# Patient Record
Sex: Male | Born: 1970 | Race: Black or African American | Hispanic: No | Marital: Married | Smoking: Never smoker
Health system: Southern US, Community
[De-identification: ages and names within clinical notes are randomized; demographics above are authoritative.]

## PROBLEM LIST (undated history)

## (undated) ENCOUNTER — Ambulatory Visit: Admission: EM | Payer: No Typology Code available for payment source | Source: Home / Self Care

## (undated) DIAGNOSIS — N401 Enlarged prostate with lower urinary tract symptoms: Secondary | ICD-10-CM

## (undated) DIAGNOSIS — N500 Atrophy of testis: Secondary | ICD-10-CM

## (undated) DIAGNOSIS — R350 Frequency of micturition: Secondary | ICD-10-CM

## (undated) DIAGNOSIS — N529 Male erectile dysfunction, unspecified: Secondary | ICD-10-CM

## (undated) DIAGNOSIS — N138 Other obstructive and reflux uropathy: Secondary | ICD-10-CM

## (undated) HISTORY — DX: Benign prostatic hyperplasia with lower urinary tract symptoms: N40.1

## (undated) HISTORY — DX: Male erectile dysfunction, unspecified: N52.9

## (undated) HISTORY — DX: Frequency of micturition: R35.0

## (undated) HISTORY — DX: Benign prostatic hyperplasia with lower urinary tract symptoms: N13.8

## (undated) HISTORY — DX: Atrophy of testis: N50.0

---

## 2014-09-16 ENCOUNTER — Emergency Department: Payer: Self-pay | Admitting: Emergency Medicine

## 2015-01-24 ENCOUNTER — Telehealth: Payer: Self-pay | Admitting: Family Medicine

## 2015-01-24 NOTE — Telephone Encounter (Signed)
I need more information about what symptoms he is experiencing at this time since stopping the finasteride.

## 2015-01-24 NOTE — Telephone Encounter (Signed)
Patient states he was having medication reaction. He was not getting an erection on Finasteride 5mg , so he stopped taking the medication.  He is still taking the Tamsilosin but states he is still not feeling the same after being on the Finesteride. I offered to schedule and appointment but patient declined and just wants a call back.

## 2015-01-25 NOTE — Telephone Encounter (Signed)
Insurance does not pay for MRI's for sizing of the prostate.  If Dr. Barnetta Hammersmith needs an MRI, Dr. Barnetta Hammersmith needs to order one.  They cost around $2000.00 out of pocket.

## 2015-01-25 NOTE — Telephone Encounter (Signed)
Spoke with pt in reference to finasteride. Pt stated since taking medication he has felt numb in the penis, unable to get an erection even with taking 50mg  of Viagra, and unable to have an erection in the mornings. Pt stated he has stopped all medications at this time "to give his body a rest" and has changed his diet. Pt also requested a referral for a MRI. Pt found a doctor in [place], Dr. Barnetta Hammersmith, who performs a procedure called PAD(prostate artery demobalizer). Per pt Dr. Barnetta Hammersmith office stated pt would need a referral and MRI prior to being seen by them. Per pt Dr. Barnetta Hammersmith needs the MRI to see how big prostate is and if he is eligible for procedure. Please advise. Cw,lpn

## 2015-01-26 NOTE — Telephone Encounter (Signed)
Spoke with pt in reference to MRI. Pt was very unhappy and rude when information was given. Nurse reinforced with pt MRIs are not done for prostate sizing and if he wanted to go see Dr. Barnetta Hammersmith then Dr. Barnetta Hammersmith would have to order it for his purposes. Nurse reinforced that insurance will only pay for medical necessities not pt elected procedures. Pt at which point explained he was unhappy with current medication and wanted something different. Nurse made pt aware he would have to make an appt to speak with Carollee Herter about that, nurse is not qualified to make those decisions. Pt was very rude and demanded tx via telephone from [facility]. Per Carollee Herter pt needs an appt to be seen, she does not tx pt via telephone. Pt was unhappy but was transferred to the front to make a f/u appt.  Cw,lpn

## 2015-02-07 ENCOUNTER — Telehealth: Payer: Self-pay | Admitting: Urology

## 2015-02-07 NOTE — Telephone Encounter (Signed)
LMOM

## 2015-02-07 NOTE — Telephone Encounter (Signed)
Patient is requesting a letter from Nicholas CowboyShannon McGowan, PA-C to his insurance company, along with office notes that includes diagnosis and symptoms, that indicates his symptoms are comparable to a 44 year old patient with BPH .  He is also requesting a referral to [facility].  Please advise.

## 2015-02-07 NOTE — Telephone Encounter (Signed)
I am not understanding this patient's request.  Why does he need a letter?  And why does he need a referral to [facility]?

## 2015-02-23 ENCOUNTER — Telehealth: Payer: Self-pay

## 2015-02-23 NOTE — Telephone Encounter (Signed)
Pt called asking if he can have a ultrasound performed since an MRI will not be ordered. Pt also asked for a [facility] referral because they perform the procedure he is electing to have to his prostate. Pt also requested his medical records. Please advise.

## 2015-02-23 NOTE — Telephone Encounter (Signed)
The folks at [facility] will most likely want to perform an ultrasound themselves if they are going to do the procedure. We can go ahead and make a referral to [facility]rology for a second opinion concerning his erectile dysfunction and BPH.

## 2015-02-26 ENCOUNTER — Telehealth: Payer: Self-pay | Admitting: Urology

## 2015-02-26 NOTE — Telephone Encounter (Signed)
Patient is still waiting on his referral to [facility]. It needs to be put in the work queue for referrals. We checked and it looks like it was never put in.  thanks

## 2015-03-07 ENCOUNTER — Other Ambulatory Visit: Payer: Self-pay | Admitting: Urology

## 2015-03-07 DIAGNOSIS — N528 Other male erectile dysfunction: Secondary | ICD-10-CM

## 2015-04-20 DIAGNOSIS — H50332 Intermittent monocular exotropia, left eye: Secondary | ICD-10-CM | POA: Insufficient documentation

## 2015-05-29 ENCOUNTER — Telehealth: Payer: Self-pay

## 2015-05-29 DIAGNOSIS — N529 Male erectile dysfunction, unspecified: Secondary | ICD-10-CM

## 2015-05-29 MED ORDER — SILDENAFIL CITRATE 100 MG PO TABS: 100.0000 mg | ORAL_TABLET | ORAL | Status: DC | PRN

## 2015-05-29 NOTE — Telephone Encounter (Signed)
Pt pharmacy sent a refill request for viargra 100mg . Please advise.

## 2015-05-29 NOTE — Telephone Encounter (Signed)
Medication called into pt pharmacy  

## 2015-05-29 NOTE — Telephone Encounter (Signed)
Okay to refill? 

## 2015-06-01 ENCOUNTER — Encounter: Payer: Self-pay | Admitting: *Deleted

## 2015-06-01 ENCOUNTER — Other Ambulatory Visit: Payer: Self-pay | Admitting: *Deleted

## 2015-06-11 ENCOUNTER — Ambulatory Visit: Payer: Self-pay | Admitting: Urology

## 2015-07-09 DIAGNOSIS — N398 Other specified disorders of urinary system: Secondary | ICD-10-CM | POA: Insufficient documentation

## 2017-11-27 ENCOUNTER — Emergency Department
Admission: EM | Admit: 2017-11-27 | Discharge: 2017-11-27 | Disposition: A | Discharge status: Home / Self Care | Payer: Self-pay | Attending: Emergency Medicine | Admitting: Emergency Medicine

## 2017-11-27 ENCOUNTER — Other Ambulatory Visit: Payer: Self-pay

## 2017-11-27 ENCOUNTER — Encounter: Payer: Self-pay | Admitting: Emergency Medicine

## 2017-11-27 ENCOUNTER — Emergency Department: Payer: Self-pay

## 2017-11-27 DIAGNOSIS — R079 Chest pain, unspecified: Secondary | ICD-10-CM

## 2017-11-27 DIAGNOSIS — Z79899 Other long term (current) drug therapy: Secondary | ICD-10-CM | POA: Insufficient documentation

## 2017-11-27 LAB — BASIC METABOLIC PANEL
Anion gap: 7 (ref 5–15)
BUN: 16 mg/dL (ref 6–20)
CALCIUM: 8.9 mg/dL (ref 8.9–10.3)
CO2: 28 mmol/L (ref 22–32)
Chloride: 103 mmol/L (ref 101–111)
Creatinine, Ser: 1.15 mg/dL (ref 0.61–1.24)
GLUCOSE: 79 mg/dL (ref 65–99)
POTASSIUM: 4 mmol/L (ref 3.5–5.1)
Sodium: 138 mmol/L (ref 135–145)

## 2017-11-27 LAB — CBC
HEMATOCRIT: 45.4 % (ref 40.0–52.0)
Hemoglobin: 15.4 g/dL (ref 13.0–18.0)
MCH: 33.5 pg (ref 26.0–34.0)
MCHC: 34 g/dL (ref 32.0–36.0)
MCV: 98.6 fL (ref 80.0–100.0)
Platelets: 195 10*3/uL (ref 150–440)
RBC: 4.6 MIL/uL (ref 4.40–5.90)
RDW: 13 % (ref 11.5–14.5)
WBC: 5.9 10*3/uL (ref 3.8–10.6)

## 2017-11-27 LAB — TROPONIN I

## 2017-11-27 MED ORDER — KETOROLAC TROMETHAMINE 30 MG/ML IJ SOLN
INTRAMUSCULAR | Status: AC
  Administered 2017-11-27: 30 mg via INTRAVENOUS
  Filled 2017-11-27: qty 1

## 2017-11-27 MED ORDER — GI COCKTAIL ~~LOC~~
ORAL | Status: AC
  Filled 2017-11-27: qty 30

## 2017-11-27 MED ORDER — PANTOPRAZOLE SODIUM 40 MG PO TBEC: 40.0000 mg | DELAYED_RELEASE_TABLET | Freq: Every day | ORAL | 1 refills | Status: DC

## 2017-11-27 MED ORDER — KETOROLAC TROMETHAMINE 30 MG/ML IJ SOLN
30.0000 mg | Freq: Once | INTRAMUSCULAR | Status: AC
  Administered 2017-11-27: 30 mg via INTRAVENOUS

## 2017-11-27 MED ORDER — GI COCKTAIL ~~LOC~~
30.0000 mL | Freq: Once | ORAL | Status: AC
  Administered 2017-11-27: 30 mL via ORAL

## 2017-11-27 NOTE — ED Notes (Signed)
MD at bedside. 

## 2017-11-27 NOTE — ED Provider Notes (Signed)
[facility] Emergency Department Provider Note    ____________________________________________   I have reviewed the triage vital signs and the nursing notes.   HISTORY  Chief Complaint Chest Pain   History limited by: Not Limited   HPI Nicholas Berg is a 47 y.o. male who presents to the emergency department today because of concerns for chest pain.  Is located in the center chest.  It started about 3 days ago.  Has been fairly constant.  Describes it as pressure-like.  He does feel that it is relieved and aggravated depending on what position he is in.  He has not had any significant shortness of breath but feels some discomfort when he tries to take a deep breath.  Patient states he has a history of acid reflux and is tried some Tums without any significant relief.  He denies any unusual exertion or ingestion prior to the pain started.  He denies any fevers.   Per medical record review patient has a history of BPH.  Past Medical History:  Diagnosis Date  . BPH with obstruction/lower urinary tract symptoms   . Erectile dysfunction   . Testicular atrophy   . Urinary frequency     There are no active problems to display for this patient.   History reviewed. No pertinent surgical history.  Prior to Admission medications   Medication Sig Start Date End Date Taking? Authorizing Provider  cyclobenzaprine (FLEXERIL) 10 MG tablet TK 1 T PO TID PRN 03/01/15   [provider]  finasteride (PROSCAR) 5 MG tablet Take 5 mg by mouth daily.    [provider]  meloxicam (MOBIC) 7.5 MG tablet TK 1 T PO  BID 03/01/15   [provider]  sildenafil (VIAGRA) 100 MG tablet Take 1 tablet (100 mg total) by mouth as needed for erectile dysfunction. 05/29/15   Michiel CowboyMcGowan, Shannon A, PA-C  tamsulosin (FLOMAX) 0.4 MG CAPS capsule Take 0.4 mg by mouth.    [provider]    Allergies Patient has no known allergies.  Family History  Problem  Relation Age of Onset  . Prostate cancer Neg Hx     Social History Social History   Tobacco Use  . Smoking status: Never Smoker  Substance Use Topics  . Alcohol use: Yes    Alcohol/week: 0.0 oz    Comment: occasiona;  Marland Kitchen. Drug use: Not on file    Review of Systems Constitutional: No fever/chills Eyes: No visual changes. ENT: No sore throat. Cardiovascular: Positive for chest pain. Respiratory: Denies shortness of breath. Gastrointestinal: No abdominal pain.  No nausea, no vomiting.  No diarrhea.   Genitourinary: Negative for dysuria. Musculoskeletal: Negative for back pain. Skin: Negative for rash. Neurological: Negative for headaches, focal weakness or numbness.  ____________________________________________   PHYSICAL EXAM:  VITAL SIGNS: ED Triage Vitals  Enc Vitals Group     BP 11/27/17 1545 116/77     Pulse Rate 11/27/17 1545 (!) 56     Resp 11/27/17 1545 16     Temp 11/27/17 1545 98.2 F (36.8 C)     Temp Source 11/27/17 1545 Oral     SpO2 11/27/17 1545 100 %     Weight 11/27/17 1544 164 lb (74.4 kg)     Height 11/27/17 1544 6' (1.829 m)     Head Circumference --      Peak Flow --      Pain Score 11/27/17 1544 6   Constitutional: Alert and oriented. Well appearing and in  no distress. Eyes: Conjunctivae are normal.  ENT   Head: Normocephalic and atraumatic.   Nose: No congestion/rhinnorhea.   Mouth/Throat: Mucous membranes are moist.   Neck: No stridor. Hematological/Lymphatic/Immunilogical: No cervical lymphadenopathy. Cardiovascular: Normal rate, regular rhythm.  No murmurs, rubs, or gallops. Respiratory: Normal respiratory effort without tachypnea nor retractions. Breath sounds are clear and equal bilaterally. No wheezes/rales/rhonchi. Gastrointestinal: Soft and non tender. No rebound. No guarding.  Genitourinary: Deferred Musculoskeletal: Normal range of motion in all extremities. No lower extremity edema. Neurologic:  Normal speech and  language. No gross focal neurologic deficits are appreciated.  Skin:  Skin is warm, dry and intact. No rash noted. Psychiatric: Mood and affect are normal. Speech and behavior are normal. Patient exhibits appropriate insight and judgment.  ____________________________________________    LABS (pertinent positives/negatives)  BMP wnl CBC wnl Trop <0.03  ____________________________________________   EKG  I, Phineas Semen, attending physician, personally viewed and interpreted this EKG  EKG Time: 1544 Rate: 52 Rhythm: sinus bradycardia Axis: normal Intervals: qtc 388 QRS: narrow ST changes: no st elevation Impression: sinus bradycardia otherwise normal ekg   ____________________________________________    RADIOLOGY  CXR Normal chest  ____________________________________________   PROCEDURES  Procedures  ____________________________________________   INITIAL IMPRESSION / ASSESSMENT AND PLAN / ED COURSE  Pertinent labs & imaging results that were available during my care of the patient were reviewed by me and considered in my medical decision making (see chart for details).  Patient presented to the emergency department today because of concerns for chest pain.  Differential be broad including pneumothorax, pneumonia, gastritis, pericardial effusion, pericarditis, ACS, costochondritis amongst other etiologies.  Chest x-ray EKG and blood work without concerning findings.  Patient does have a history of acid reflux.  Discussed findings with patient.  Will plan on starting patient on PPI and given information about food choices with GERD.  ____________________________________________   FINAL CLINICAL IMPRESSION(S) / ED DIAGNOSES  Final diagnoses:  Nonspecific chest pain     Note: This dictation was prepared with Dragon dictation. Any transcriptional errors that result from this process are unintentional     Phineas Semen, MD 11/27/17 1901

## 2017-11-27 NOTE — ED Triage Notes (Signed)
Pt to ED via POV c/o chest pain on and off for the past 3 days. Pt states that he has been trying to "work through it". Pt denies N/V, and shortness of breath but states that he can tell a difference in his breathing. Pt states that the pain is in the center of chest and does not radiate. Pt is in NAD at this time.

## 2017-11-27 NOTE — Discharge Instructions (Addendum)
Please seek medical attention for any high fevers, chest pain, shortness of breath, change in behavior, persistent vomiting, bloody stool or any other new or concerning symptoms.  

## 2018-11-26 IMAGING — CR DG CHEST 2V
2 series · 2 of 2 positions shown · non-contrast
Comparison: None.

CLINICAL DATA: Central chest pain beginning 3 days ago.

EXAM:
CHEST - 2 VIEW

[chest pa]
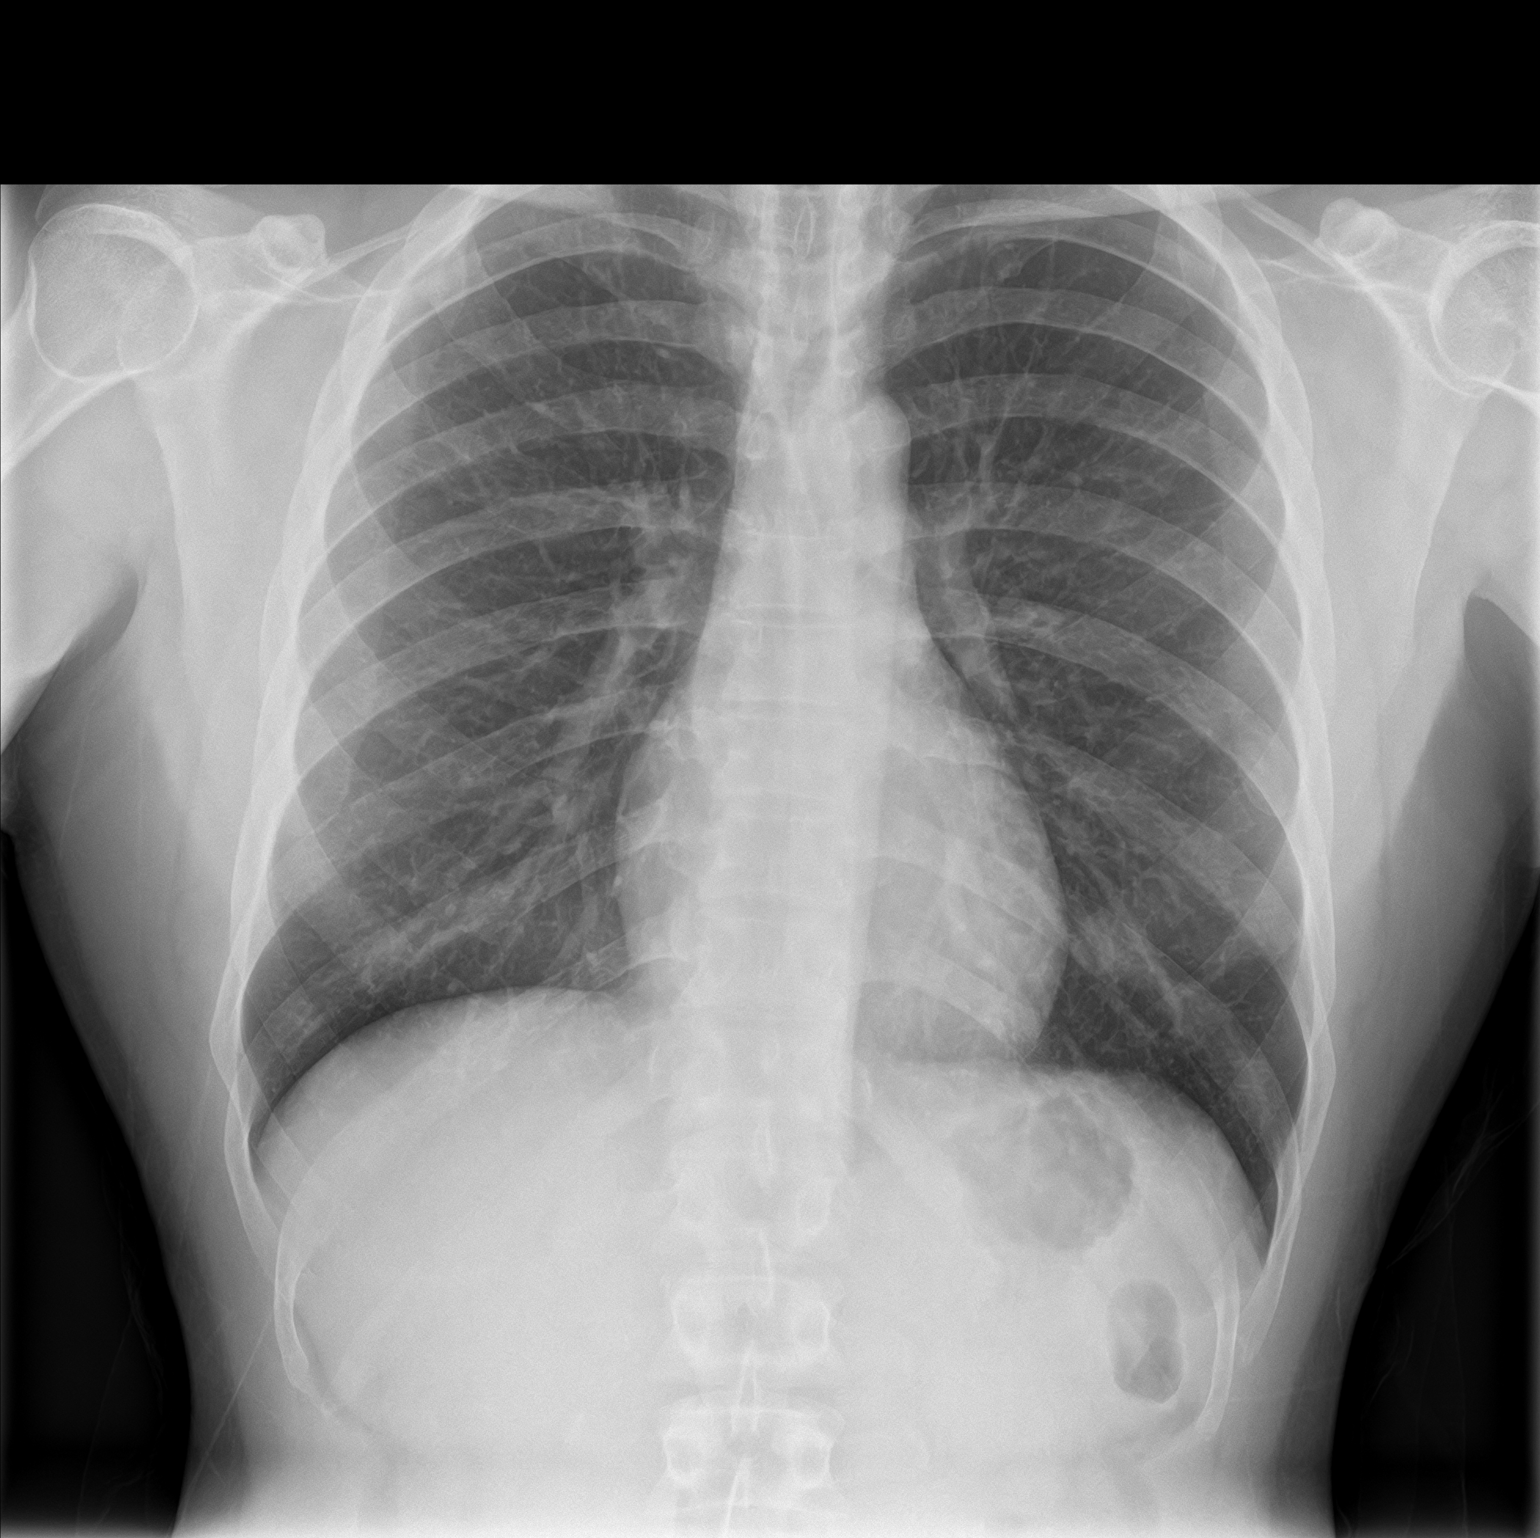

[chest lat]
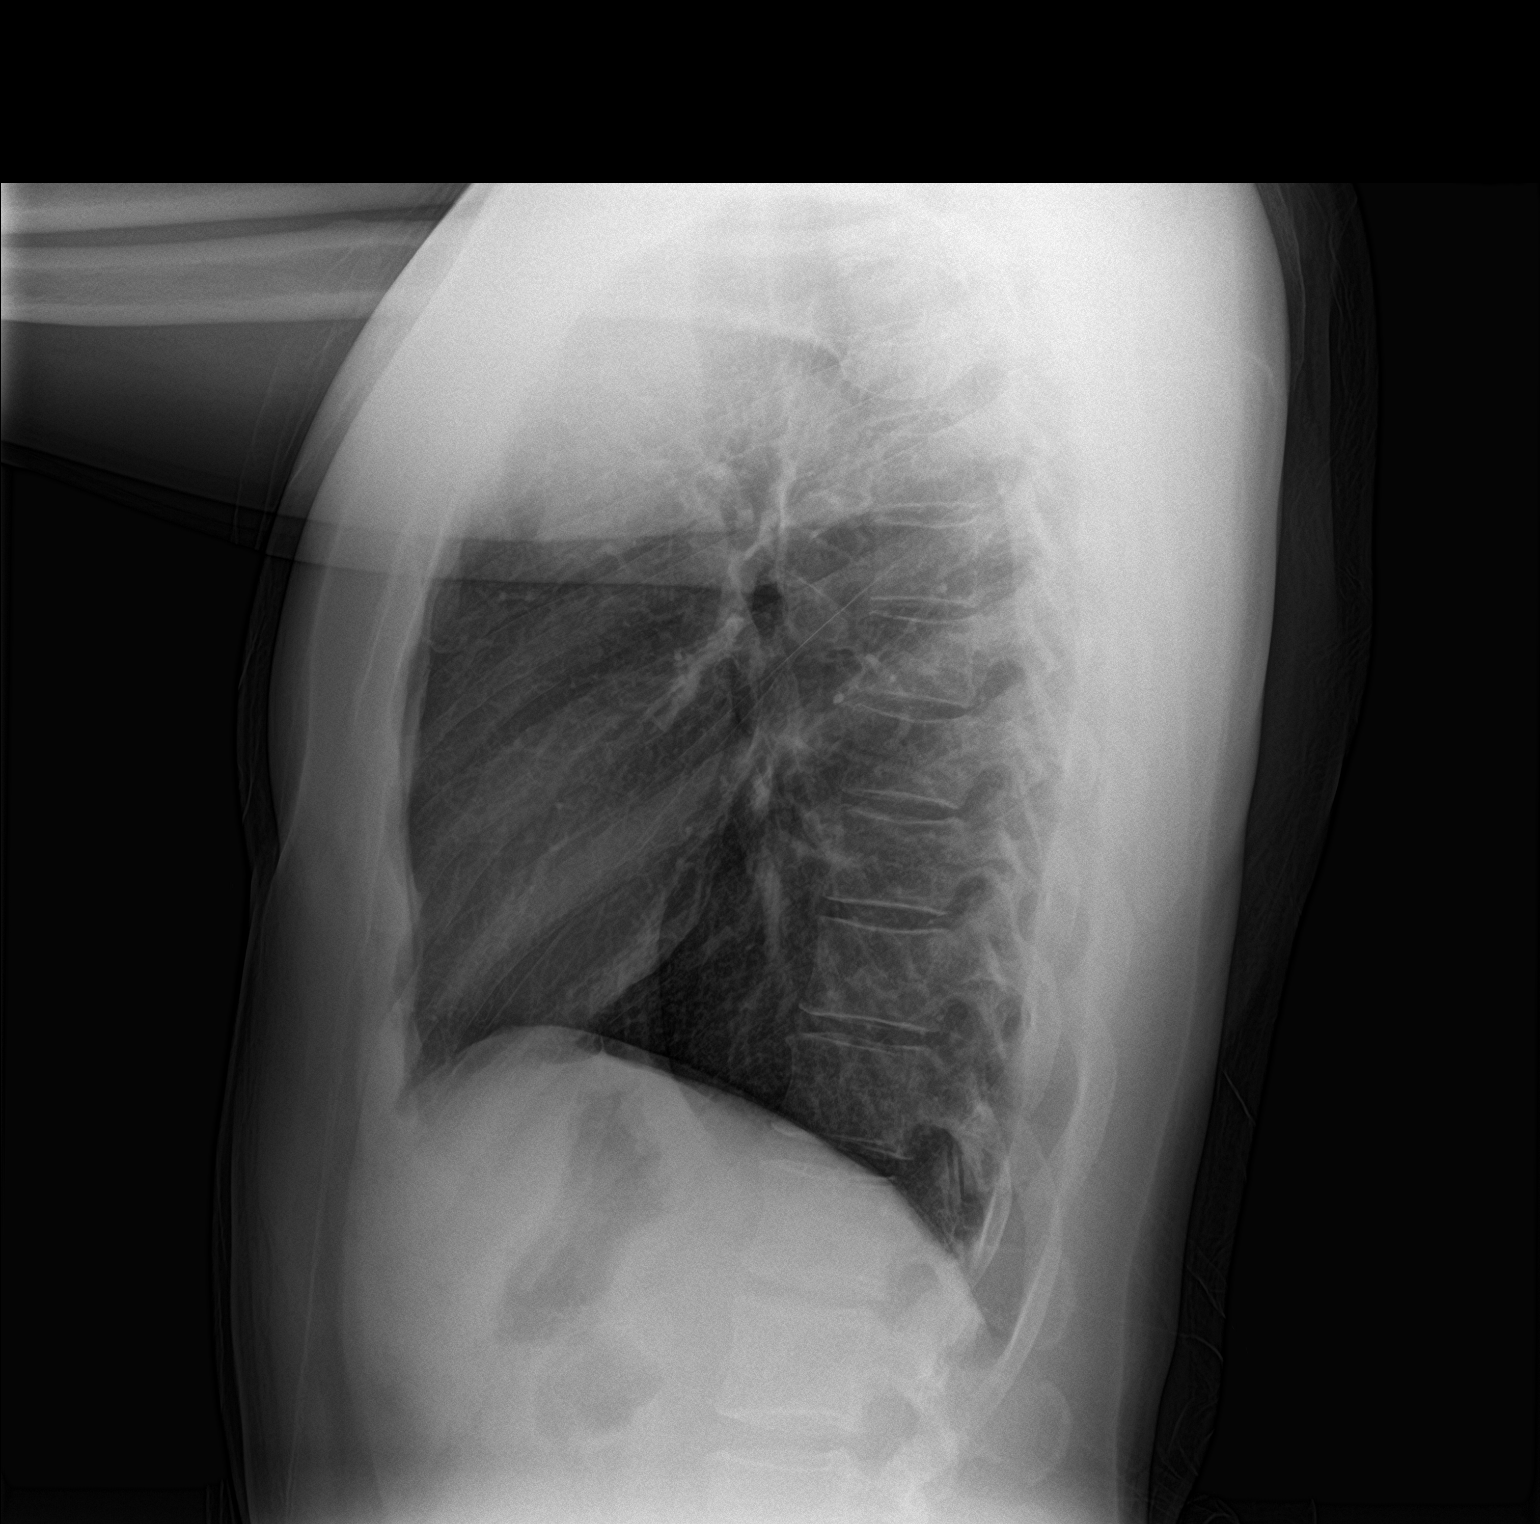

[2 of 2 positions shown; findings below may reference images not displayed]

FINDINGS: Heart size is normal. Mediastinal shadows are normal. The lungs are
clear. No bronchial thickening. No infiltrate, mass, effusion or
collapse. Pulmonary vascularity is normal. No bony abnormality.
IMPRESSION: Normal chest

## 2019-09-19 ENCOUNTER — Ambulatory Visit: Payer: Self-pay | Admitting: Family Medicine

## 2019-09-19 ENCOUNTER — Other Ambulatory Visit: Payer: Self-pay

## 2019-09-19 DIAGNOSIS — Z113 Encounter for screening for infections with a predominantly sexual mode of transmission: Secondary | ICD-10-CM

## 2019-09-19 LAB — GRAM STAIN

## 2019-09-19 NOTE — Progress Notes (Signed)
In for STD screening due to "stinging" which began ~2 days after last intercourse and later resolved Sharlette Dense, RN  Gram stain reviewed-no Tx indicated Sharlette Dense, RN

## 2019-09-19 NOTE — Addendum Note (Signed)
Addended by: Ann Held on: 09/19/2019 01:24 PM   Modules accepted: Orders

## 2019-09-19 NOTE — Progress Notes (Addendum)
  [facility] Department STI clinic/screening visit  Subjective:  Nicholas Berg is a 49 y.o. male being seen today for  Chief Complaint  Patient presents with  . SEXUALLY TRANSMITTED DISEASE     The patient reports they do have symptoms.   Patient has the following medical conditions:   Patient Active Problem List   Diagnosis Date Noted  . 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 Intermittent monocular exotropia of left eye 04/20/2015    HPI  Pt reports he has been having burning with urination x1 wk. Would like STI screening.   See flowsheet for further details and programmatic requirements.   Tdap: 2020   No components found for: HCV  The following portions of the patient's history were reviewed and updated as appropriate: allergies, current medications, past medical history, past social history, past surgical history and problem list.  Objective:  There were no vitals filed for this visit.   Physical Exam Constitutional:      Appearance: Normal appearance.  HENT:     Berg: Normocephalic and atraumatic.     Comments: No nits or hair loss    Mouth/Throat:     Mouth: Mucous membranes are moist.     Pharynx: Oropharynx is clear. No oropharyngeal exudate or posterior oropharyngeal erythema.  Pulmonary:     Effort: Pulmonary effort is normal.  Abdominal:     General: Abdomen is flat.     Palpations: Abdomen is soft. There is no hepatomegaly or mass.     Tenderness: There is no abdominal tenderness.  Genitourinary:    Pubic Area: No rash or pubic lice.      Penis: Normal and uncircumcised.      Testes: Normal.     Epididymis:     Right: Normal.     Left: Normal.     Rectum: Normal.  Lymphadenopathy:     Berg:     Right side of Berg: No preauricular or posterior auricular adenopathy.     Left side of Berg: No preauricular or posterior auricular adenopathy.     Cervical: No cervical adenopathy.     Upper Body:     Right upper body: No supraclavicular or  axillary adenopathy.     Left upper body: No supraclavicular or axillary adenopathy.     Lower Body: No right inguinal adenopathy. No left inguinal adenopathy.  Skin:    General: Skin is warm and dry.     Findings: No rash.  Neurological:     Mental Status: He is alert and oriented to person, place, and time.       Assessment and Plan:  Nicholas Berg is a 49 y.o. male presenting to the [facility] Department for STI screening   1. Screening examination for venereal disease -Screenings today as below. Treat gram stain per standing order. -Patient does not meet criteria for HepB, HepC Screening.  -Counseled on warning s/sx and when to seek care. Recommended condom use with all sex and discussed importance of condom use for STI prevention. -Advised if STI screening is negative and dysuria persists to see PCP for further UTI workup.  - Gram stain - Chlamydia/Gonorrhea [place] [facility] - HIV [place] [facility] - Syphilis Serology,  Lab - Gonococcus culture   Return for screening as needed.  No future appointments.  Ann Held, PA-C

## 2019-09-23 LAB — GONOCOCCUS CULTURE

## 2019-11-12 ENCOUNTER — Ambulatory Visit: Payer: Medicaid Other | Attending: Internal Medicine

## 2019-11-12 DIAGNOSIS — Z23 Encounter for immunization: Secondary | ICD-10-CM

## 2019-11-12 NOTE — Progress Notes (Signed)
   Covid-19 Vaccination Clinic  Name:  Taveon Enyeart    MRN: [id number] DOB: November 19, 1970  11/12/2019  Mr. Plemmons was observed post Covid-19 immunization for 15 minutes without incident. He was provided with Vaccine Information Sheet and instruction to access the V-Safe system.   Mr. Venable was instructed to call 911 with any severe reactions post vaccine: Marland Kitchen Difficulty breathing  . Swelling of face and throat  . A fast heartbeat  . A bad rash all over body  . Dizziness and weakness   Immunizations Administered    Name Date Dose VIS Date Route   Pfizer COVID-19 Vaccine 11/12/2019  1:38 PM 0.3 mL 07/15/2019 Intramuscular   Manufacturer: [employer], [employer]   Lot: [id number]   NDC: [address]-9

## 2019-12-07 ENCOUNTER — Ambulatory Visit: Payer: Medicaid Other | Attending: Internal Medicine

## 2019-12-07 DIAGNOSIS — Z23 Encounter for immunization: Secondary | ICD-10-CM

## 2019-12-07 NOTE — Progress Notes (Signed)
   Covid-19 Vaccination Clinic  Name:  Neshawn Aird    MRN: [id number] DOB: 01/17/1971  12/07/2019  Mr. Perezperez was observed post Covid-19 immunization for 15 minutes without incident. He was provided with Vaccine Information Sheet and instruction to access the V-Safe system.   Mr. Sprung was instructed to call 911 with any severe reactions post vaccine: Marland Kitchen Difficulty breathing  . Swelling of face and throat  . A fast heartbeat  . A bad rash all over body  . Dizziness and weakness   Immunizations Administered    Name Date Dose VIS Date Route   Pfizer COVID-19 Vaccine 12/07/2019  3:59 PM 0.3 mL 09/28/2018 Intramuscular   Manufacturer: [employer], [employer]   Lot: [id number]   NDC: [address]-3

## 2021-02-05 ENCOUNTER — Other Ambulatory Visit: Payer: Self-pay

## 2021-02-05 ENCOUNTER — Ambulatory Visit
Admission: EM | Admit: 2021-02-05 | Discharge: 2021-02-05 | Disposition: A | Discharge status: Home / Self Care | Payer: No Typology Code available for payment source | Attending: Family Medicine | Admitting: Family Medicine

## 2021-02-05 DIAGNOSIS — M898X1 Other specified disorders of bone, shoulder: Secondary | ICD-10-CM

## 2021-02-05 MED ORDER — TIZANIDINE HCL 4 MG PO TABS: 4.0000 mg | ORAL_TABLET | Freq: Three times a day (TID) | ORAL | 0 refills | Status: DC | PRN

## 2021-02-05 NOTE — ED Triage Notes (Signed)
Patient complains of upper back at left shuolder blade x 2-3 weeks. States that this comes and goes but is stronger pain now. States that he would like an x-ray of this area.

## 2021-02-05 NOTE — Discharge Instructions (Addendum)
Heat.  Massage will help.  Medication as needed.  Take care  Dr. Adriana Simas

## 2021-02-06 NOTE — ED Provider Notes (Signed)
[facility]    CSN: [id number] Arrival date & time: 02/05/21  1355  History   Chief Complaint Chief Complaint  Patient presents with   Back Pain    HPI 50 year old male presents with pain around the scapula.  This has been going on for weeks.  Worse over the past 2 to 3 weeks.  Pain is located just medial to the left scapula.  It is tender to palpation.  Worse with activity.  No relieving factors.  The pain is intermittent.  No fall, trauma, injury.  No other associated symptoms.  No other complaints.  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 Home Medications    Prior to Admission medications   Medication Sig Start Date End Date Taking? Authorizing Provider  gabapentin (NEURONTIN) 300 MG capsule Take by mouth. 06/08/15  Yes [provider]  tiZANidine (ZANAFLEX) 4 MG tablet Take 1 tablet (4 mg total) by mouth every 8 (eight) hours as needed for muscle spasms. 02/05/21  Yes Tommie Sams, DO    Family History Family History  Problem Relation Age of Onset   Prostate cancer Neg Hx     Social History Social History   Tobacco Use   Smoking status: Never   Smokeless tobacco: Never  Vaping Use   Vaping Use: Never used  Substance Use Topics   Alcohol use: Yes    Alcohol/week: 0.0 standard drinks    Comment: occasional, 1x/mo   Drug use: Never     Allergies   Patient has no known allergies.   Review of Systems Review of Systems Per HPI  Physical Exam Triage Vital Signs ED Triage Vitals  Enc Vitals Group     BP 02/05/21 1559 115/66     Pulse Rate 02/05/21 1559 66     Resp 02/05/21 1559 18     Temp 02/05/21 1559 98.4 F (36.9 C)     Temp Source 02/05/21 1559 Oral     SpO2  02/05/21 1559 100 %     Weight 02/05/21 1556 156 lb (70.8 kg)     Height 02/05/21 1556 6' (1.829 m)     Head Circumference --      Peak Flow --      Pain Score 02/05/21 1556 7     Pain Loc --      Pain Edu? --      Excl. in GC? --    Updated Vital Signs BP 115/66 (BP Location: Left Arm)   Pulse 66   Temp 98.4 F (36.9 C) (Oral)   Resp 18   Ht 6' (1.829 m)   Wt 70.8 kg   SpO2 100%   BMI 21.16 kg/m   Visual Acuity Right Eye Distance:   Left Eye Distance:   Bilateral Distance:    Right Eye Near:   Left Eye Near:    Bilateral Near:     Physical Exam Constitutional:      General: He is not in acute distress.    Appearance: Normal appearance. He is not ill-appearing.  HENT:     Head: Normocephalic and atraumatic.  Eyes:     General:        Right eye: No  discharge.        Left eye: No discharge.     Conjunctiva/sclera: Conjunctivae normal.  Cardiovascular:     Rate and Rhythm: Normal rate and regular rhythm.  Pulmonary:     Effort: Pulmonary effort is normal.     Breath sounds: Normal breath sounds.  Musculoskeletal:     Comments: Exquisite tenderness just medial to the left scapula.  Neurological:     Mental Status: He is alert.     UC Treatments / Results  Labs (all labs ordered are listed, but only abnormal results are displayed) Labs Reviewed - No data to display  EKG   Radiology No results found.  Procedures Procedures (including critical care time)  Medications Ordered in UC Medications - No data to display  Initial Impression / Assessment and Plan / UC Course  I have reviewed the triage vital signs and the nursing notes.  Pertinent labs & imaging results that were available during my care of the patient were reviewed by me and considered in my medical decision making (see chart for details).    50 year old male presents with periscapular pain.  This is muscular in nature.  Associated spasm.  Advised heat and Zanaflex.  Supportive  care.  Final Clinical Impressions(s) / UC Diagnoses   Final diagnoses:  Periscapular pain     Discharge Instructions      Heat.  Massage will help.  Medication as needed.  Take care  Dr. Adriana Simas    ED Prescriptions     Medication Sig Dispense Auth. Provider   tiZANidine (ZANAFLEX) 4 MG tablet Take 1 tablet (4 mg total) by mouth every 8 (eight) hours as needed for muscle spasms. 30 tablet Tommie Sams, DO      PDMP not reviewed this encounter.   Tommie Sams, [place] 02/06/21 1521

## 2021-04-13 ENCOUNTER — Ambulatory Visit
Admission: EM | Admit: 2021-04-13 | Discharge: 2021-04-13 | Disposition: A | Discharge status: Home / Self Care | Payer: No Typology Code available for payment source | Attending: Family Medicine | Admitting: Family Medicine

## 2021-04-13 ENCOUNTER — Encounter: Payer: Self-pay | Admitting: Emergency Medicine

## 2021-04-13 ENCOUNTER — Other Ambulatory Visit: Payer: Self-pay

## 2021-04-13 DIAGNOSIS — N398 Other specified disorders of urinary system: Secondary | ICD-10-CM | POA: Diagnosis not present

## 2021-04-13 LAB — POCT URINALYSIS DIP (DEVICE)
Bilirubin Urine: NEGATIVE
Glucose, UA: NEGATIVE mg/dL
Hgb urine dipstick: NEGATIVE
Ketones, ur: NEGATIVE mg/dL
Leukocytes,Ua: NEGATIVE
Nitrite: NEGATIVE
Protein, ur: NEGATIVE mg/dL
Specific Gravity, Urine: 1.02 (ref 1.005–1.030)
Urobilinogen, UA: 0.2 mg/dL (ref 0.0–1.0)
pH: 7 (ref 5.0–8.0)

## 2021-04-13 NOTE — Discharge Instructions (Signed)
No evidence of UTI.  Contact Info: [facility] [address] [facility]   [address]   [place], [place] [address]   [phone]   Raynor, Gerald Stabs, MD

## 2021-04-13 NOTE — ED Triage Notes (Signed)
Pt is present today with difficulty urinating, slight odor, and still drips after done urinating. Pt states that his sx started x2 weeks ago.

## 2021-04-13 NOTE — ED Provider Notes (Signed)
[facility]    CSN: [id number] Arrival date & time: 04/13/21  0919      History   Chief Complaint Chief Complaint  Patient presents with  . Urinary Retention    HPI  50 year old male presents with urinary symptoms.  Patient states that he has had symptoms for the past 2 weeks.  He reports difficulty urinating.  He reports a slight odor to his urine and he has some dripping after he urinates.  Patient is concerned that he has a urinary tract infection.  Patient has a history of voiding dysfunction.  He has previously been seen by urology.  He has not followed up.  No fever.  No significant pain.  Past Medical History:  Diagnosis Date  . BPH with obstruction/lower urinary tract symptoms   . Erectile dysfunction   . Testicular atrophy   . Urinary frequency     Patient Active Problem List   Diagnosis Date Noted  . 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 Prostate cancer Neg Hx     Social History Social History   Tobacco Use  . Smoking status: Never  . Smokeless tobacco: Never  Vaping Use  . Vaping Use: Never used  Substance Use Topics  . Alcohol use: Yes    Alcohol/week: 0.0 standard drinks    Comment: occasional, 1x/mo  . Drug use: Never     Allergies   Patient has no known allergies.   Review of Systems Review of Systems Per HPI  Physical Exam Triage Vital Signs ED Triage Vitals  Enc Vitals Group     BP 04/13/21 1018 117/79     Pulse Rate 04/13/21 1018 (!) 50     Resp 04/13/21 1018 18     Temp --      Temp Source 04/13/21 1018 Oral     SpO2 04/13/21 1018 100 %     Weight --      Height --      Head Circumference --      Peak Flow --      Pain Score 04/13/21 1017 0     Pain Loc --      Pain Edu? --      Excl. in GC? --    Updated Vital  Signs BP 117/79 (BP Location: Left Arm)   Pulse (!) 50   Resp 18   SpO2 100%   Visual Acuity Right Eye Distance:   Left Eye Distance:   Bilateral Distance:    Right Eye Near:   Left Eye Near:    Bilateral Near:     Physical Exam Vitals and nursing note reviewed.  Constitutional:      General: He is not in acute distress.    Appearance: Normal appearance. He is not ill-appearing.  HENT:     Head: Normocephalic and atraumatic.  Cardiovascular:     Rate and Rhythm: Normal rate and regular rhythm.  Pulmonary:     Effort: Pulmonary effort is normal.     Breath sounds: Normal breath sounds. No wheezing, rhonchi or rales.  Neurological:     Mental Status: He is alert.  Psychiatric:        Mood and Affect: Mood normal.        Behavior: Behavior normal.  UC Treatments / Results  Labs (all labs ordered are listed, but only abnormal results are displayed) Labs Reviewed  POCT URINALYSIS DIP (DEVICE)    EKG   Radiology No results found.  Procedures Procedures (including critical care time)  Medications Ordered in UC Medications - No data to display  Initial Impression / Assessment and Plan / UC Course  I have reviewed the triage vital signs and the nursing notes.  Pertinent labs & imaging results that were available during my care of the patient were reviewed by me and considered in my medical decision making (see chart for details).    50 year old male presents with voiding dysfunction.  There is no evidence of urinary tract infection at this time.  We discussed treatment options.  I have advised him to return to [facility] urology for repeat evaluation and discussion of therapeutic options.   Final Clinical Impressions(s) / UC Diagnoses   Final diagnoses:  Voiding dysfunction     Discharge Instructions      No evidence of UTI.  Contact Info: [facility] [address] [facility]   [address]   [place], [place] [address]   [phone]   Raynor,  Gerald Stabs, MD       ED Prescriptions   None    PDMP not reviewed this encounter.   Tommie Sams, DO 04/13/21 1058

## 2021-04-19 ENCOUNTER — Emergency Department
Admission: EM | Admit: 2021-04-19 | Discharge: 2021-04-19 | Disposition: A | Discharge status: Home / Self Care | Payer: No Typology Code available for payment source | Attending: Emergency Medicine | Admitting: Emergency Medicine

## 2021-04-19 ENCOUNTER — Other Ambulatory Visit: Payer: Self-pay

## 2021-04-19 DIAGNOSIS — W540XXA Bitten by dog, initial encounter: Secondary | ICD-10-CM | POA: Diagnosis not present

## 2021-04-19 DIAGNOSIS — S61452A Open bite of left hand, initial encounter: Secondary | ICD-10-CM | POA: Diagnosis not present

## 2021-04-19 DIAGNOSIS — Y9301 Activity, walking, marching and hiking: Secondary | ICD-10-CM | POA: Diagnosis not present

## 2021-04-19 NOTE — ED Triage Notes (Signed)
Pt comes into the ED via EMS, pt states he walking and a neighbors dog jumped the fence biting him on the left hand, no noted breaks in the skin  132/83 59HR 99%RA

## 2021-04-19 NOTE — ED Provider Notes (Signed)
[facility] Emergency Department Provider Note   ____________________________________________    I have reviewed the triage vital signs and the nursing notes.   HISTORY  Chief Complaint Animal Bite     HPI Nicholas Berg is a 50 y.o. male with history as noted below who presents after a dog attacked him.  Patient reports the dog escaped its fence in his neighborhood and threatened him.  He reports that it took his left hand in its mouth to bite him but luckily did not break the skin.  Normal range of motion of the fingers.  Police were involved.  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 No pertinent surgical history.  Prior to Admission medications   Not on File     Allergies Patient has no known allergies.  Family History  Problem Relation Age of Onset   Prostate cancer Neg Hx     Social History Social History   Tobacco Use   Smoking status: Never   Smokeless tobacco: Never  Vaping Use   Vaping Use: Never used  Substance Use Topics   Alcohol use: Yes    Alcohol/week: 0.0 standard drinks    Comment: occasional, 1x/mo   Drug use: Never    Review of Systems       Musculoskeletal: As above Skin: No skin break     ____________________________________________   PHYSICAL EXAM:  VITAL SIGNS: ED Triage Vitals  Enc Vitals Group     BP 04/19/21 1143 130/82     Pulse Rate 04/19/21 1143 (!) 51     Resp 04/19/21 1143 15     Temp 04/19/21 1148 98 F (36.7 C)     Temp src --      SpO2 04/19/21 1143 96 %     Weight --      Height --      Head Circumference --       Peak Flow --      Pain Score 04/19/21 1200 3     Pain Loc --      Pain Edu? --      Excl. in GC? --      Constitutional: Alert and oriented. No acute distress. Pleasant and interactive  Head: Atraumatic.   Musculoskeletal: Hands: No skin break, no bruising, no painful range of motion, normal tendon function Neurologic:  Normal speech and language. No gross focal neurologic deficits are appreciated.   Skin:  Skin is warm, dry and intact. No rash noted.  No skin break   ____________________________________________   LABS (all labs ordered are listed, but only abnormal results are displayed)  Labs Reviewed - No data to display ____________________________________________  EKG   ____________________________________________  RADIOLOGY  None ____________________________________________   PROCEDURES  Procedure(s) performed: No  Procedures   Critical Care performed: No ____________________________________________   INITIAL IMPRESSION / ASSESSMENT AND PLAN / ED COURSE  Pertinent labs & imaging results that were available during my care of the patient were reviewed by me and considered in my medical decision making (see chart for details).   Patient here for evaluation, exam  is reassuring, no skin break, known owners and vaccination status, dog can be monitored.  Reassurance provided   ____________________________________________   FINAL CLINICAL IMPRESSION(S) / ED DIAGNOSES  Final diagnoses:  Dog bite, initial encounter      NEW MEDICATIONS STARTED DURING THIS VISIT:  There are no discharge medications for this patient.    Note:  This document was prepared using Dragon voice recognition software and may include unintentional dictation errors.    Jene Every, MD 04/19/21 1227

## 2021-04-19 NOTE — Discharge Instructions (Addendum)
Because no skin break no need for antibiotics or rabies prophylaxis
